# Patient Record
Sex: Male | Born: 1985 | Race: White | Hispanic: No | Marital: Married | Smoking: Former smoker
Health system: Southern US, Community
[De-identification: ages and names within clinical notes are randomized; demographics above are authoritative.]

## PROBLEM LIST (undated history)

## (undated) HISTORY — PX: KNEE SURGERY: SHX244

---

## 2014-11-12 ENCOUNTER — Emergency Department: Payer: Self-pay | Admitting: Emergency Medicine

## 2014-11-12 LAB — CBC WITH DIFFERENTIAL/PLATELET
BASOS PCT: 0.3 %
Basophil #: 0 10*3/uL (ref 0.0–0.1)
EOS PCT: 1.6 %
Eosinophil #: 0.2 10*3/uL (ref 0.0–0.7)
HCT: 44.1 % (ref 40.0–52.0)
HGB: 15.1 g/dL (ref 13.0–18.0)
LYMPHS ABS: 2 10*3/uL (ref 1.0–3.6)
Lymphocyte %: 22.3 %
MCH: 31.8 pg (ref 26.0–34.0)
MCHC: 34.1 g/dL (ref 32.0–36.0)
MCV: 93 fL (ref 80–100)
MONOS PCT: 5.6 %
Monocyte #: 0.5 x10 3/mm (ref 0.2–1.0)
Neutrophil #: 6.4 10*3/uL (ref 1.4–6.5)
Neutrophil %: 70.2 %
PLATELETS: 320 10*3/uL (ref 150–440)
RBC: 4.73 10*6/uL (ref 4.40–5.90)
RDW: 13.3 % (ref 11.5–14.5)
WBC: 9.2 10*3/uL (ref 3.8–10.6)

## 2014-11-12 LAB — COMPREHENSIVE METABOLIC PANEL
ALBUMIN: 4.2 g/dL (ref 3.4–5.0)
ALT: 25 U/L
ANION GAP: 7 (ref 7–16)
AST: 18 U/L (ref 15–37)
Alkaline Phosphatase: 77 U/L
BUN: 11 mg/dL (ref 7–18)
Bilirubin,Total: 0.7 mg/dL (ref 0.2–1.0)
Calcium, Total: 9 mg/dL (ref 8.5–10.1)
Chloride: 106 mmol/L (ref 98–107)
Co2: 27 mmol/L (ref 21–32)
Creatinine: 0.84 mg/dL (ref 0.60–1.30)
EGFR (African American): >60
EGFR (Non-African Amer.): >60
Glucose: 91 mg/dL (ref 65–99)
Osmolality: 278 (ref 275–301)
POTASSIUM: 4.1 mmol/L (ref 3.5–5.1)
Sodium: 140 mmol/L (ref 136–145)
Total Protein: 7.6 g/dL (ref 6.4–8.2)

## 2014-11-12 LAB — URINALYSIS, COMPLETE
BLOOD: NEGATIVE
Bacteria: NONE SEEN
Bilirubin,UR: NEGATIVE
Glucose,UR: NEGATIVE mg/dL (ref 0–75)
Ketone: NEGATIVE
Leukocyte Esterase: NEGATIVE
Nitrite: NEGATIVE
Ph: 6 (ref 4.5–8.0)
RBC,UR: 2 /HPF (ref 0–5)
SPECIFIC GRAVITY: 1.025 (ref 1.003–1.030)
Squamous Epithelial: <1
WBC UR: 2 /HPF (ref 0–5)

## 2014-11-12 LAB — LIPASE, BLOOD: Lipase: 78 U/L (ref 73–393)

## 2015-01-11 ENCOUNTER — Emergency Department: Payer: Self-pay | Admitting: Emergency Medicine

## 2015-01-25 ENCOUNTER — Inpatient Hospital Stay
Admit: 2015-01-25 | Disposition: A | Discharge status: Home / Self Care | Payer: Self-pay | Attending: Internal Medicine | Admitting: Internal Medicine

## 2015-01-25 LAB — COMPREHENSIVE METABOLIC PANEL
ALK PHOS: 78 U/L
ALT: 12 U/L — AB
AST: 16 U/L
Albumin: 4 g/dL
Anion Gap: 10 (ref 7–16)
BUN: 16 mg/dL
Bilirubin,Total: 0.6 mg/dL
CALCIUM: 9.4 mg/dL
CO2: 29 mmol/L
Chloride: 101 mmol/L
Creatinine: 0.74 mg/dL
EGFR (African American): >60
EGFR (Non-African Amer.): >60
Glucose: 100 mg/dL — ABNORMAL HIGH
Potassium: 3.7 mmol/L
SODIUM: 140 mmol/L
Total Protein: 7.7 g/dL

## 2015-01-25 LAB — CBC WITH DIFFERENTIAL/PLATELET
Basophil #: 0.1 10*3/uL (ref 0.0–0.1)
Basophil %: 1 %
EOS ABS: 0.2 10*3/uL (ref 0.0–0.7)
Eosinophil %: 2.1 %
HCT: 39.9 % — ABNORMAL LOW (ref 40.0–52.0)
HGB: 13.4 g/dL (ref 13.0–18.0)
Lymphocyte #: 2.4 10*3/uL (ref 1.0–3.6)
Lymphocyte %: 24.8 %
MCH: 30.2 pg (ref 26.0–34.0)
MCHC: 33.5 g/dL (ref 32.0–36.0)
MCV: 90 fL (ref 80–100)
Monocyte #: 0.7 x10 3/mm (ref 0.2–1.0)
Monocyte %: 7.6 %
NEUTROS PCT: 64.5 %
Neutrophil #: 6.3 10*3/uL (ref 1.4–6.5)
PLATELETS: 403 10*3/uL (ref 150–440)
RBC: 4.44 10*6/uL (ref 4.40–5.90)
RDW: 12.6 % (ref 11.5–14.5)
WBC: 9.7 10*3/uL (ref 3.8–10.6)

## 2015-01-26 LAB — MRSA PCR SCREENING

## 2015-01-27 LAB — VANCOMYCIN, TROUGH: Vancomycin, Trough: 11 ug/mL

## 2015-01-29 LAB — WOUND CULTURE

## 2015-01-30 LAB — CULTURE, BLOOD (SINGLE)

## 2015-02-02 LAB — WOUND CULTURE
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
## 2015-02-28 NOTE — Consult Note (Signed)
Chief Complaint:  Subjective/Chief Complaint Still sore, but no fevers. Pain controlled with po meds. Getting around well on crutches WBAT on left LE.   VITAL SIGNS/ANCILLARY NOTES: **Vital Signs.:   31-Mar-16 04:58  Temperature Temperature (F) 98.1  Pulse Pulse 70  Systolic BP Systolic BP 106  Diastolic BP (mmHg) Diastolic BP (mmHg) 67    08:22  Temperature Temperature (F) 98.2  Pulse Pulse 90  Systolic BP Systolic BP 119  Diastolic BP (mmHg) Diastolic BP (mmHg) 75   Lab Results: Routine Micro:  28-Mar-16 11:29   Organism 1 RARE STREPTOCOCCUS PYOGENES (GROUP A)   Assessment/Plan:  Assessment/Plan:  Assessment S/P I&D Left medial knee wound   Plan Convert to Keflex 500 QID for 10 days based on Strep species identified on admission swab culture. Will change dressing again this AM. Expect that he can be d/c'd today and will f/u with me next week for a wound check.   Electronic Signatures: Derald MacleodPoggi, Jeffrey (MD)  (Signed 31-Mar-16 09:04)  Authored: Chief Complaint, VITAL SIGNS/ANCILLARY NOTES, Lab Results, Assessment/Plan   Last Updated: 31-Mar-16 09:04 by Derald MacleodPoggi, Jeffrey (MD)
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
## 2015-02-28 NOTE — Consult Note (Signed)
Chief Complaint:  Subjective/Chief Complaint Pt. notes moderate medial left knee pain controlled with po meds. Lateral-sided left knee pain noted last night has resolved by this AM.   VITAL SIGNS/ANCILLARY NOTES: **Vital Signs.:   30-Mar-16 04:00  Temperature Temperature (F) 97.8  Pulse Pulse 63  Systolic BP Systolic BP 120  Diastolic BP (mmHg) Diastolic BP (mmHg) 74    08:25  Temperature Temperature (F) 98  Pulse Pulse 70  Systolic BP Systolic BP 128  Diastolic BP (mmHg) Diastolic BP (mmHg) 68   Brief Assessment:  GEN well developed, well nourished, no acute distress   Respiratory normal resp effort   Additional Physical Exam Dressing changed ths AM by my PA and iodoform gauze advanced. Wound healing well. Pt. is NV intact to left LE.   Assessment/Plan:  Assessment/Plan:  Assessment S/P I&D medial left knee wound.   Plan Continue present iv abx regimen until organism and sensitivities known.  We will change dressing again in AM, but expect he will be ready for D/C home tomorrow once sensitivities of organism known so as to prescribe appropriate po abx.  He is to start daily sterile saline wet to dry dressings to medial knee wound as of Friday.  We will see him back in office next week for a wound check.   Electronic Signatures: Derald MacleodPoggi, Jeffrey (MD)  (Signed 30-Mar-16 14:36)  Authored: Chief Complaint, VITAL SIGNS/ANCILLARY NOTES, Brief Assessment, Assessment/Plan   Last Updated: 30-Mar-16 14:36 by Derald MacleodPoggi, Jeffrey (MD)

## 2015-02-28 NOTE — Consult Note (Signed)
Brief Consult Note: Diagnosis: Infected left medial knee wound.   Patient was seen by consultant.   Consult note dictated.   Recommend to proceed with surgery or procedure.   Orders entered.   Discussed with Attending MD.   Comments: The patient has a fairly deep wound on the medial aspect of his left knee.  Although it does not appear to have involved his knee joint, I feel that it would be best to perform a formal I&D of the wound in the OR tomorrow.  The procedure, as well as the potential risks and benefits, were discussed with the patient and he agrees to proceed.  The case will be done tomorrow afternoon.  Thanks!.  Electronic Signatures: Flower Franko, JeffreyDerald Macleod (MD)  (Signed 28-Mar-16 18:01)  Authored: Brief Consult Note   Last Updated: 28-Mar-16 18:01 by Derald MacleodPoggi, Jeffrey (MD)
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
## 2015-07-15 ENCOUNTER — Ambulatory Visit
Admission: EM | Admit: 2015-07-15 | Discharge: 2015-07-15 | Disposition: A | Discharge status: Home / Self Care | Payer: Medicaid Other | Attending: Family Medicine | Admitting: Family Medicine

## 2015-07-15 ENCOUNTER — Encounter: Payer: Self-pay | Admitting: *Deleted

## 2015-07-15 DIAGNOSIS — S51811A Laceration without foreign body of right forearm, initial encounter: Secondary | ICD-10-CM | POA: Diagnosis not present

## 2015-07-15 MED ORDER — KETOROLAC TROMETHAMINE 60 MG/2ML IM SOLN
60.0000 mg | Freq: Once | INTRAMUSCULAR | Status: AC
  Administered 2015-07-15: 60 mg via INTRAMUSCULAR

## 2015-07-15 MED ORDER — MUPIROCIN 2 % EX OINT: 1.0000 "application " | TOPICAL_OINTMENT | Freq: Three times a day (TID) | CUTANEOUS | Status: DC

## 2015-07-15 MED ORDER — SULFAMETHOXAZOLE-TRIMETHOPRIM 800-160 MG PO TABS: 1.0000 | ORAL_TABLET | Freq: Two times a day (BID) | ORAL | Status: AC

## 2015-07-15 NOTE — Discharge Instructions (Signed)
3 days of clean and dry please The nshower/bather- remove bandage afterwards-wash gently -pat dry-apply topical antibiotic gel Keep covered for your work until sutures removed Return in 10 days for suture removal Return more quickly if any concerns about infection are noted. Thank you for choosing [place] today ! Rae Halsted PA-C   Laceration Care, Adult A laceration is a cut or lesion that goes through all layers of the skin and into the tissue just beneath the skin. TREATMENT  Some lacerations may not require closure. Some lacerations may not be able to be closed due to an increased risk of infection. It is important to see your caregiver as soon as possible after an injury to minimize the risk of infection and maximize the opportunity for successful closure. If closure is appropriate, pain medicines may be given, if needed. The wound will be cleaned to help prevent infection. Your caregiver will use stitches (sutures), staples, wound glue (adhesive), or skin adhesive strips to repair the laceration. These tools bring the skin edges together to allow for faster healing and a better cosmetic outcome. However, all wounds will heal with a scar. Once the wound has healed, scarring can be minimized by covering the wound with sunscreen during the day for 1 full year. HOME CARE INSTRUCTIONS  For sutures or staples:  Keep the wound clean and dry.  If you were given a bandage (dressing), you should change it at least once a day. Also, change the dressing if it becomes wet or dirty, or as directed by your caregiver.  Wash the wound with soap and water 2 times a day. Rinse the wound off with water to remove all soap. Pat the wound dry with a clean towel.  After cleaning, apply a thin layer of the antibiotic ointment as recommended by your caregiver. This will help prevent infection and keep the dressing from sticking.  You may shower as usual after the first 24 hours. Do not soak the wound in water  until the sutures are removed.  Only take over-the-counter or prescription medicines for pain, discomfort, or fever as directed by your caregiver.  Get your sutures or staples removed as directed by your caregiver. For skin adhesive strips:  Keep the wound clean and dry.  Do not get the skin adhesive strips wet. You may bathe carefully, using caution to keep the wound dry.  If the wound gets wet, pat it dry with a clean towel.  Skin adhesive strips will fall off on their own. You may trim the strips as the wound heals. Do not remove skin adhesive strips that are still stuck to the wound. They will fall off in time. For wound adhesive:  You may briefly wet your wound in the shower or bath. Do not soak or scrub the wound. Do not swim. Avoid periods of heavy perspiration until the skin adhesive has fallen off on its own. After showering or bathing, gently pat the wound dry with a clean towel.  Do not apply liquid medicine, cream medicine, or ointment medicine to your wound while the skin adhesive is in place. This may loosen the film before your wound is healed.  If a dressing is placed over the wound, be careful not to apply tape directly over the skin adhesive. This may cause the adhesive to be pulled off before the wound is healed.  Avoid prolonged exposure to sunlight or tanning lamps while the skin adhesive is in place. Exposure to ultraviolet light in the first year  will darken the scar.  The skin adhesive will usually remain in place for 5 to 10 days, then naturally fall off the skin. Do not pick at the adhesive film. You may need a tetanus shot if:  You cannot remember when you had your last tetanus shot.  You have never had a tetanus shot. If you get a tetanus shot, your arm may swell, get red, and feel warm to the touch. This is common and not a problem. If you need a tetanus shot and you choose not to have one, there is a rare chance of getting tetanus. Sickness from tetanus can  be serious. SEEK MEDICAL CARE IF:   You have redness, swelling, or increasing pain in the wound.  You see a red line that goes away from the wound.  You have yellowish-white fluid (pus) coming from the wound.  You have a fever.  You notice a bad smell coming from the wound or dressing.  Your wound breaks open before or after sutures have been removed.  You notice something coming out of the wound such as wood or glass.  Your wound is on your hand or foot and you cannot move a finger or toe. SEEK IMMEDIATE MEDICAL CARE IF:   Your pain is not controlled with prescribed medicine.  You have severe swelling around the wound causing pain and numbness or a change in color in your arm, hand, leg, or foot.  Your wound splits open and starts bleeding.  You have worsening numbness, weakness, or loss of function of any joint around or beyond the wound.  You develop painful lumps near the wound or on the skin anywhere on your body. MAKE SURE YOU:   Understand these instructions.  Will watch your condition.  Will get help right away if you are not doing well or get worse. Document Released: 10/16/2005 Document Revised: 01/08/2012 Document Reviewed: 04/11/2011 [facility] Patient Information 2015 [place], [place]. This information is not intended to replace advice given to you by your health care provider. Make sure you discuss any questions you have with your health care provider.

## 2015-07-15 NOTE — ED Notes (Signed)
Fell at work and lacerated right forearm and abraded left forearm. Laceration occurred at 11:00 the AM.

## 2015-07-15 NOTE — ED Provider Notes (Signed)
CSN: [id number]     Arrival date & time 07/15/15  1242 History   First MD Initiated Contact with Patient 07/15/15 1317     Chief Complaint  Patient presents with  . Extremity Laceration    right forearm   (Consider location/radiation/quality/duration/timing/severity/associated sxs/prior Treatment) HPI 29 yo WM presents with right forearm laceration. Working on Associate Professor at work. Slipped and cut himself on the edge of a metal bracket that ties the truss to the wood beam  after it is constructed  into units  Lacerated medial right forearm.   Active bleeding has stopped.  Denies loss of sensation or function. Wife present throughout and supportive. Had left leg injury in March 2016 requiring surgery. Tetanus updated at that time   History reviewed. No pertinent past medical history. Past Surgical History  Procedure Laterality Date  . Knee surgery Left    Family History  Problem Relation Age of Onset  . Heart attack Mother   . Heart attack Father    Social History  Substance Use Topics  . Smoking status: Current Every Day Smoker  . Smokeless tobacco: Never Used  . Alcohol Use: Yes    Review of Systems Constitutional: No fever.  Eyes: No visual changes. ENT:No sore throat. Cardiovascular:Negative for chest pain/palpitations Respiratory: Negative for shortness of breath Gastrointestinal: No abdominal pain. No nausea,vomiting, diarrhea Genitourinary: Negative for dysuria. Normal urination. Musculoskeletal: Negative for back pain. FROM extremities -right  forearm laceration.  Good grip and cap fill, full ROM elbow Skin: Negative for rash Neurological: Negative for headache, focal weakness or numbness  Allergies  Review of patient's allergies indicates no known allergies.  Home Medications   Prior to Admission medications   Medication Sig Start Date End Date Taking? Authorizing Provider  mupirocin ointment (BACTROBAN) 2 % Apply 1 application topically 3 (three) times  daily. 07/15/15   Rae Halsted, PA-C  sulfamethoxazole-trimethoprim (BACTRIM DS,SEPTRA DS) 800-160 MG per tablet Take 1 tablet by mouth 2 (two) times daily. 07/15/15 07/22/15  Rae Halsted, PA-C   Meds Ordered and Administered this Visit   Medications  ketorolac (TORADOL) injection 60 mg (60 mg Intramuscular Given 07/15/15 1344)  well tolerated- good relief of discomfort  BP 116/56 mmHg  Pulse 73  Temp(Src) 98 F (36.7 C) (Oral)  Resp 18  Ht  (1.676 m)  Wt 155 lb (70.308 kg)  BMI 25.03 kg/m2  SpO2 98% No data found.   Physical Exam  Constitutional: Alert and oriented, well appearing, VS are noted,  General : no acute distress; mildly concerned-" never had stitches" HEENT:  Head:normocephalic, atraumatic,                Eyes: conjugate gaze,negative conjunctiva     Ears:hearing grossly  WNL     Nose:normal,     Mouth/throat :Mucous membranes moist, Neck :  supple  Lung:   effort and breath sounds normal , no distress Heart- normal rate MSK:   nontender, normal ROM all extremities;normal flexion; ambulatory, on and off table without assistance- Right forearm with 2 lacerations. Upper lac is superficial skin only, linear 6 cm total but with only 2 central cm requiring reapproximation. Below that is a second more significant full thickness laceration ,curved with a flap and planed edges approx 5 cm in length. No neurosensory deficit reported, full function right hand and wrist, Neuro: CN ll-Xl as tested,grossly intact; normal gait, normal speech and language Skin:  Warm,dry,intact Psych: mood and affect WNL  ED Course  LACERATION REPAIR Date/Time: 07/15/2015 8:52 PM Performed by: Rae Halsted Authorized by: Hassan Rowan Consent: Verbal consent obtained. Risks and benefits: risks, benefits and alternatives were discussed Consent given by: patient and spouse Patient understanding: patient states understanding of the procedure being performed Patient identity confirmed:  verbally with patient and arm band Time out: Immediately prior to procedure a "time out" was called to verify the correct patient, procedure, equipment, support staff and site/side marked as required. Body area: upper extremity Location details: right lower arm Laceration length: 7 cm Foreign bodies: no foreign bodies Tendon involvement: none Nerve involvement: none Vascular damage: no Anesthesia: local infiltration Local anesthetic: lidocaine 1% with epinephrine Anesthetic total: 4 ml Patient sedated: no Preparation: Patient was prepped and draped in the usual sterile fashion. Irrigation solution: saline Irrigation method: syringe Amount of cleaning: standard Debridement: none Degree of undermining: none Skin closure: 3-0 nylon and Ethilon Number of sutures: 7 Technique: simple Approximation: close Approximation difficulty: simple Dressing: gauze roll and non-adhesive packing strip Patient tolerance: Patient tolerated the procedure well with no immediate complications   (including critical care time) Procedure addendum: I failed to comment that after the lower incision was closed attention was placed on the upper more superficial wound.Prior to the start: Both wounds had been scrubbed with Surgical prep sponges and thoroughly rinsed and irrigated with saline via syringe before closing. The upper wound was longer and very shallow except for the 2 cm center section which was re-approximated with 2 of the aforementioned sutures. The wounds were dressed together in a forearm wrap with vaseline gauze, roll gauze and Coban. Patient tolerated very well    Labs Review Labs Reviewed - No data to display  Imaging Review No results found.      MDM   1. Laceration of skin of right forearm, initial encounter   2. Laceration of right forearm without complication, initial encounter    Diagnosis and treatment discussed.Will cover with po and topical antibitotics . Keep clean and dry  x 3 days Then shower, then change dressing.  Questions fielded, expectations and recommendations reviewed. Patient expresses understanding.  Will return to [facility] with questions, concern or exacerbation- an in 10 days for suture removal  Discharge Medication List as of 07/15/2015  2:22 PM    START taking these medications   Details  mupirocin ointment (BACTROBAN) 2 % Apply 1 application topically 3 (three) times daily., Starting 07/15/2015, Until Discontinued, Normal    sulfamethoxazole-trimethoprim (BACTRIM DS,SEPTRA DS) 800-160 MG per tablet Take 1 tablet by mouth 2 (two) times daily., Starting 07/15/2015, Until Thu 07/22/15, Normal         Rae Halsted, PA-C 07/16/15 2121

## 2015-07-16 ENCOUNTER — Encounter: Payer: Self-pay | Admitting: Physician Assistant

## 2015-09-22 ENCOUNTER — Emergency Department
Admission: EM | Admit: 2015-09-22 | Discharge: 2015-09-22 | Disposition: A | Discharge status: Home / Self Care | Payer: Medicaid Other | Attending: Student | Admitting: Student

## 2015-09-22 ENCOUNTER — Encounter: Payer: Self-pay | Admitting: Emergency Medicine

## 2015-09-22 DIAGNOSIS — F1210 Cannabis abuse, uncomplicated: Secondary | ICD-10-CM | POA: Insufficient documentation

## 2015-09-22 DIAGNOSIS — F17200 Nicotine dependence, unspecified, uncomplicated: Secondary | ICD-10-CM | POA: Diagnosis not present

## 2015-09-22 DIAGNOSIS — F1110 Opioid abuse, uncomplicated: Secondary | ICD-10-CM | POA: Insufficient documentation

## 2015-09-22 LAB — URINE DRUG SCREEN, QUALITATIVE (ARMC ONLY)
AMPHETAMINES, UR SCREEN: NOT DETECTED
BARBITURATES, UR SCREEN: NOT DETECTED
BENZODIAZEPINE, UR SCRN: NOT DETECTED
Cannabinoid 50 Ng, Ur ~~LOC~~: POSITIVE — AB
Cocaine Metabolite,Ur ~~LOC~~: NOT DETECTED
MDMA (Ecstasy)Ur Screen: NOT DETECTED
METHADONE SCREEN, URINE: NOT DETECTED
Opiate, Ur Screen: NOT DETECTED
Phencyclidine (PCP) Ur S: NOT DETECTED
TRICYCLIC, UR SCREEN: NOT DETECTED

## 2015-09-22 LAB — COMPREHENSIVE METABOLIC PANEL
ALT: 20 U/L (ref 17–63)
AST: 22 U/L (ref 15–41)
Albumin: 4.6 g/dL (ref 3.5–5.0)
Alkaline Phosphatase: 63 U/L (ref 38–126)
Anion gap: 5 (ref 5–15)
BUN: 14 mg/dL (ref 6–20)
CO2: 31 mmol/L (ref 22–32)
CREATININE: 0.67 mg/dL (ref 0.61–1.24)
Calcium: 9.5 mg/dL (ref 8.9–10.3)
Chloride: 103 mmol/L (ref 101–111)
GFR calc Af Amer: >60 mL/min (ref >60)
GFR calc non Af Amer: >60 mL/min (ref >60)
Glucose, Bld: 90 mg/dL (ref 65–99)
POTASSIUM: 4.3 mmol/L (ref 3.5–5.1)
SODIUM: 139 mmol/L (ref 135–145)
Total Bilirubin: 0.7 mg/dL (ref 0.3–1.2)
Total Protein: 7.8 g/dL (ref 6.5–8.1)

## 2015-09-22 LAB — CBC
HEMATOCRIT: 45.4 % (ref 40.0–52.0)
Hemoglobin: 15.2 g/dL (ref 13.0–18.0)
MCH: 30.8 pg (ref 26.0–34.0)
MCHC: 33.4 g/dL (ref 32.0–36.0)
MCV: 92 fL (ref 80.0–100.0)
PLATELETS: 301 10*3/uL (ref 150–440)
RBC: 4.94 MIL/uL (ref 4.40–5.90)
RDW: 13.2 % (ref 11.5–14.5)
WBC: 6.4 10*3/uL (ref 3.8–10.6)

## 2015-09-22 NOTE — ED Provider Notes (Signed)
[facility] Emergency Department Provider Note  ____________________________________________  Time seen: Approximately 11:40 AM  I have reviewed the triage vital signs and the nursing notes.   HISTORY  Chief Complaint Addiction Problem    HPI Juan FanningRobert Preston Pedro Montez HagemanJr. is a 29 y.o. male with no chronic medical problems who presents for evaluation due to desire for oxycodone detox. Patient reports that he has used oxycodone for many years, gradual onset, intermittent since onset, currently severe. No SI, HI or audiovisual hallucinations. No chest pain or difficulty breathing, no coughing, sneezing, runny nose, congestion, vomiting, diarrhea, fevers or chills. He is otherwise been in good health but states that he needs help to stop abusing oxycodone.   History reviewed. No pertinent past medical history.  There are no active problems to display for this patient.   Past Surgical History  Procedure Laterality Date  . Knee surgery Left     Current Outpatient Rx  Name  Route  Sig  Dispense  Refill  . mupirocin ointment (BACTROBAN) 2 %   Topical   Apply 1 application topically 3 (three) times daily. Patient not taking: Reported on 09/22/2015   22 g   0     Allergies Review of patient's allergies indicates no known allergies.  Family History  Problem Relation Age of Onset  . Heart attack Mother   . Heart attack Father     Social History Social History  Substance Use Topics  . Smoking status: Current Every Day Smoker  . Smokeless tobacco: Never Used  . Alcohol Use: Yes    Review of Systems Constitutional: No fever/chills Eyes: No visual changes. ENT: No sore throat. Cardiovascular: Denies chest pain. Respiratory: Denies shortness of breath. Gastrointestinal: No abdominal pain.  No nausea, no vomiting.  No diarrhea.  No constipation. Genitourinary: Negative for dysuria. Musculoskeletal: Negative for back pain. Skin: Negative for  rash. Neurological: Negative for headaches, focal weakness or numbness.  10-point ROS otherwise negative.  ____________________________________________   PHYSICAL EXAM:  VITAL SIGNS: ED Triage Vitals  Enc Vitals Group     BP 09/22/15 1114 148/93 mmHg     Pulse Rate 09/22/15 1114 97     Resp 09/22/15 1114 20     Temp 09/22/15 1114 97.9 F (36.6 C)     Temp Source 09/22/15 1114 Oral     SpO2 09/22/15 1114 97 %     Weight 09/22/15 1114 158 lb (71.668 kg)     Height 09/22/15 1114 5\' 7"  (1.702 m)     Head Cir --      Peak Flow --      Pain Score --      Pain Loc --      Pain Edu? --      Excl. in GC? --     Constitutional: Alert and oriented. Well appearing and in no acute distress. Eyes: Conjunctivae are normal. PERRL. EOMI. Head: Atraumatic. Nose: No congestion/rhinnorhea. Mouth/Throat: Mucous membranes are moist.  Oropharynx non-erythematous. Neck: No stridor.   Cardiovascular: Normal rate, regular rhythm. Grossly normal heart sounds.  Good peripheral circulation. Respiratory: Normal respiratory effort.  No retractions. Lungs CTAB. Gastrointestinal: Soft and nontender. No distention.  No CVA tenderness. Genitourinary: deferred Musculoskeletal: No lower extremity tenderness nor edema.  No joint effusions. Neurologic:  Normal speech and language. No gross focal neurologic deficits are appreciated. No gait instability. Skin:  Skin is warm, dry and intact. No rash noted. Psychiatric: Mood and affect are normal. Speech and behavior are normal.  ____________________________________________   LABS (all labs ordered are listed, but only abnormal results are displayed)  Labs Reviewed  URINE DRUG SCREEN, QUALITATIVE ([facility] ONLY) - Abnormal; Notable for the following:    Cannabinoid 50 Ng, Ur [place] POSITIVE (*)    All other components within normal limits  CBC  COMPREHENSIVE METABOLIC PANEL    ____________________________________________  EKG  none ____________________________________________  RADIOLOGY  none ____________________________________________   PROCEDURES  Procedure(s) performed: None  Critical Care performed: No  ____________________________________________   INITIAL IMPRESSION / ASSESSMENT AND PLAN / ED COURSE  Pertinent labs & imaging results that were available during my care of the patient were reviewed by me and considered in my medical decision making (see chart for details).  Juan Fanning Carachure Montez Hageman. is a 29 y.o. male with no chronic medical problems who presents for evaluation due to desire for oxycodone detox. On exam, he is very well-appearing and in no acute distress. Vital signs stable, he is afebrile. He has a benign physical exam  And no acute medical complaints. No SI, HI or visual hallucinations, no indications for involuntary commitment. Labs reviewed. Normal CBC and CMP. Urine drug screen is positive for cannabis only. Discussed with behavioral health who will evaluate him and help to arrange placement in detox program versus discuss outpatient resources with him. He is medically cleared.  ----------------------------------------- 2:57 PM on 09/22/2015 -----------------------------------------  [name] of Behavioral Health has discussed outpatient resources with the patient and he will follow up with [facility] substance abuse intensive outpatient therapy. He is not a candidate for [facility]. He is comfortable with the discharge plan. DC home. ____________________________________________   FINAL CLINICAL IMPRESSION(S) / ED DIAGNOSES  Final diagnoses:  Narcotic abuse, continuous      Gayla Doss, MD 09/22/15 1458

## 2015-09-22 NOTE — ED Notes (Signed)
Patient assigned to appropriate care area. Patient oriented to unit/care area: Informed that, for their safety, care areas are designed for safety and monitored by security cameras at all times; and visiting hours explained to patient. Patient verbalizes understanding, and verbal contract for safety obtained. 

## 2015-09-22 NOTE — Progress Notes (Signed)
Per request of ER MD Inocencio Homes(Gayle), writer provided the pt. with information and instructions on how to access Outpatient Mental Health & Substance Abuse Treatment ([facility]).   TTS has called [facility] to verify bed availability and the representative has informed staff that this pt has attemted to come there for services on several occasions and been turned away. [place] [facility] [place] and needs to call his insurance provider to located an approved detox facility.     Representative [facility] Contact information [phone]. This information will be provided to the pt.    09/22/2015 Cheryl FlashNicole Dvonte Gatliff, MS, NCC, LPCA Therapeutic Triage Specialist

## 2015-09-22 NOTE — ED Notes (Signed)
Want help with drug abuse  States he abuses oxy.

## 2016-01-21 ENCOUNTER — Encounter: Payer: Self-pay | Admitting: Emergency Medicine

## 2016-01-21 ENCOUNTER — Emergency Department
Admission: EM | Admit: 2016-01-21 | Discharge: 2016-01-21 | Disposition: A | Discharge status: Home / Self Care | Payer: Medicaid Other | Attending: Emergency Medicine | Admitting: Emergency Medicine

## 2016-01-21 DIAGNOSIS — H6691 Otitis media, unspecified, right ear: Secondary | ICD-10-CM | POA: Insufficient documentation

## 2016-01-21 DIAGNOSIS — F17200 Nicotine dependence, unspecified, uncomplicated: Secondary | ICD-10-CM | POA: Insufficient documentation

## 2016-01-21 DIAGNOSIS — H671 Otitis media in diseases classified elsewhere, right ear: Secondary | ICD-10-CM

## 2016-01-21 MED ORDER — AMOXICILLIN 500 MG PO TABS: 500.0000 mg | ORAL_TABLET | Freq: Three times a day (TID) | ORAL | Status: DC

## 2016-01-21 MED ORDER — CIPROFLOXACIN-HYDROCORTISONE 0.2-1 % OT SUSP: 3.0000 [drp] | Freq: Two times a day (BID) | OTIC | Status: DC

## 2016-01-21 MED ORDER — HYDROCODONE-ACETAMINOPHEN 5-325 MG PO TABS: 1.0000 | ORAL_TABLET | ORAL | Status: DC | PRN

## 2016-01-21 NOTE — ED Provider Notes (Signed)
[facility] Emergency Department Provider Note  ____________________________________________  Time seen: Approximately 3:23 PM  I have reviewed the triage vital signs and the nursing notes.   HISTORY  Chief Complaint Otalgia    HPI [name]. is a 30 y.o. male presents for evaluation of right ear pain for 2-3 days. Patient reports shooting up and now gone but the exhaust went right into his ear creating a sudden loud noise. Patient really had drainage and ringing in his ear. Now he currently complains of having dizziness with changes of position and constant pain with swallowing.  History reviewed. No pertinent past medical history.  There are no active problems to display for this patient.   Past Surgical History  Procedure Laterality Date  . Knee surgery Left     Current Outpatient Rx  Name  Route  Sig  Dispense  Refill  . amoxicillin (AMOXIL) 500 MG tablet   Oral   Take 1 tablet (500 mg total) by mouth 3 (three) times daily.   30 tablet   0   . ciprofloxacin-hydrocortisone (CIPRO HC) otic suspension   Right Ear   Place 3 drops into the right ear 2 (two) times daily.   10 mL   0   . HYDROcodone-acetaminophen (NORCO) 5-325 MG tablet   Oral   Take 1-2 tablets by mouth every 4 (four) hours as needed for moderate pain.   15 tablet   0     Allergies Review of patient's allergies indicates no known allergies.  Family History  Problem Relation Age of Onset  . Heart attack Mother   . Heart attack Father     Social History Social History  Substance Use Topics  . Smoking status: Current Every Day Smoker  . Smokeless tobacco: Never Used  . Alcohol Use: Yes    Review of Systems Constitutional: No fever/chills ENT: Right eardrum pain. Neurological: Negative for headaches, focal weakness or numbness.  10-point ROS otherwise negative.  ____________________________________________   PHYSICAL EXAM:  VITAL SIGNS: ED  Triage Vitals  Enc Vitals Group     BP 01/21/16 1411 133/73 mmHg     Pulse Rate 01/21/16 1411 74     Resp 01/21/16 1411 16     Temp 01/21/16 1411 98.1 F (36.7 C)     Temp Source 01/21/16 1411 Oral     SpO2 01/21/16 1411 100 %     Weight 01/21/16 1411 165 lb (74.844 kg)     Height 01/21/16 1411  (1.702 m)     Head Cir --      Peak Flow --      Pain Score 01/21/16 1412 8     Pain Loc --      Pain Edu? --      Excl. in GC? --     Constitutional: Alert and oriented. Well appearing and in no acute distress. Head: Right TM extremely erythematous no evidence of puncture wound if any at pinpoint and not detectable.. Mouth/Throat: Mucous membranes are moist.  Oropharynx non-erythematous. Neck: No stridor.   Neurologic:  Normal speech and language. No gross focal neurologic deficits are appreciated. No gait instability. Skin:  Skin is warm, dry and intact. No rash noted. Psychiatric: Mood and affect are normal. Speech and behavior are normal.  ____________________________________________   LABS (all labs ordered are listed, but only abnormal results are displayed)  Labs Reviewed - No data to display    PROCEDURES  Procedure(s) performed: None  Critical  Care performed: No  ____________________________________________   INITIAL IMPRESSION / ASSESSMENT AND PLAN / ED COURSE  Pertinent labs & imaging results that were available during my care of the patient were reviewed by me and considered in my medical decision making (see chart for details).  Acute right ear trauma secondary to the loud noise. Acute right otitis media. Rx given for amoxicillin 500 mg 3 times a day and ciprofloxacin HC otic drops. Patient to follow up with ENT if symptomatology worsens. ____________________________________________   FINAL CLINICAL IMPRESSION(S) / ED DIAGNOSES  Final diagnoses:  Otitis media of right ear in disease classified elsewhere     This chart was dictated using voice  recognition software/Dragon. Despite best efforts to proofread, errors can occur which can change the meaning. Any change was purely unintentional.   Evangeline Dakinharles M Ylianna Almanzar, PA-C 01/21/16 1605  Sharman CheekPhillip Stafford, MD 01/24/16 [phone]
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
## 2016-01-21 NOTE — Discharge Instructions (Signed)
Ear Drops, Adult You have been diagnosed with a condition requiring you to put drops of medicine into your outer ear. HOME CARE INSTRUCTIONS   Put drops in the affected ear as instructed. After putting the drops in, you will need to lie down with the affected ear facing up for ten minutes so the drops will remain in the ear canal and run down and fill the canal. Continue using the ear drops for as long as directed by your health care provider.  Prior to getting up, put a cotton ball gently in your ear canal. Leave enough of the cotton ball out so it can be easily removed. Do not attempt to push this down into the canal with a cotton-tipped swab or other instrument.  Do not irrigate or wash out your ears if you have had a perforated eardrum or mastoid surgery, or unless instructed to do so by your health care provider.  Keep appointments with your health care provider as instructed.  Finish all medicine, or use for the length of time prescribed by your health care provider. Continue the drops even if your problem seems to be doing well after a couple days, or continue as instructed. SEEK MEDICAL CARE IF:  You become worse or develop increasing pain.  You notice any unusual drainage from your ear (particularly if the drainage has a bad smell).  You develop hearing difficulties.  You experience a serious form of dizziness in which you feel as if the room is spinning, and you feel nauseated (vertigo).  The outside of your ear becomes red or swollen or both. This may be a sign of an allergic reaction. MAKE SURE YOU:   Understand these instructions.  Will watch your condition.  Will get help right away if you are not doing well or get worse.   This information is not intended to replace advice given to you by your health care provider. Make sure you discuss any questions you have with your health care provider.   Document Released: 10/10/2001 Document Revised: 11/06/2014 Document  Reviewed: 05/13/2013 Elsevier Interactive Patient Education 2016 [employer]  Otitis Media, Adult Otitis media is redness, soreness, and puffiness (swelling) in the space just behind your eardrum (middle ear). It may be caused by allergies or infection. It often happens along with a cold. HOME CARE  Take your medicine as told. Finish it even if you start to feel better.  Only take over-the-counter or prescription medicines for pain, discomfort, or fever as told by your doctor.  Follow up with your doctor as told. GET HELP IF:  You have otitis media only in one ear, or bleeding from your nose, or both.  You notice a lump on your neck.  You are not getting better in 3-5 days.  You feel worse instead of better. GET HELP RIGHT AWAY IF:   You have pain that is not helped with medicine.  You have puffiness, redness, or pain around your ear.  You get a stiff neck.  You cannot move part of your face (paralysis).  You notice that the bone behind your ear hurts when you touch it. MAKE SURE YOU:   Understand these instructions.  Will watch your condition.  Will get help right away if you are not doing well or get worse.   This information is not intended to replace advice given to you by your health care provider. Make sure you discuss any questions you have with your health care provider.   Document  Released: 04/03/2008 Document Revised: 11/06/2014 Document Reviewed: 05/13/2013 Elsevier Interactive Patient Education [employer]
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
## 2017-03-11 ENCOUNTER — Emergency Department: Payer: Self-pay

## 2017-03-11 ENCOUNTER — Encounter: Payer: Self-pay | Admitting: Emergency Medicine

## 2017-03-11 ENCOUNTER — Emergency Department
Admission: EM | Admit: 2017-03-11 | Discharge: 2017-03-11 | Disposition: A | Discharge status: Home / Self Care | Payer: Self-pay | Attending: Emergency Medicine | Admitting: Emergency Medicine

## 2017-03-11 DIAGNOSIS — W01198A Fall on same level from slipping, tripping and stumbling with subsequent striking against other object, initial encounter: Secondary | ICD-10-CM | POA: Insufficient documentation

## 2017-03-11 DIAGNOSIS — Y939 Activity, unspecified: Secondary | ICD-10-CM | POA: Insufficient documentation

## 2017-03-11 DIAGNOSIS — S20211A Contusion of right front wall of thorax, initial encounter: Secondary | ICD-10-CM | POA: Insufficient documentation

## 2017-03-11 DIAGNOSIS — Y999 Unspecified external cause status: Secondary | ICD-10-CM | POA: Insufficient documentation

## 2017-03-11 DIAGNOSIS — Y929 Unspecified place or not applicable: Secondary | ICD-10-CM | POA: Insufficient documentation

## 2017-03-11 DIAGNOSIS — F17210 Nicotine dependence, cigarettes, uncomplicated: Secondary | ICD-10-CM | POA: Insufficient documentation

## 2017-03-11 MED ORDER — IBUPROFEN 800 MG PO TABS: 800.0000 mg | ORAL_TABLET | Freq: Three times a day (TID) | ORAL | 0 refills | Status: AC

## 2017-03-11 MED ORDER — ONDANSETRON HCL 4 MG/2ML IJ SOLN
INTRAMUSCULAR | Status: AC
  Filled 2017-03-11: qty 2

## 2017-03-11 NOTE — ED Provider Notes (Signed)
[facility] Emergency Department Provider Note  ____________________________________________   First MD Initiated Contact with Patient 03/11/17 1703     (approximate)  I have reviewed the triage vital signs and the nursing notes.   HISTORY  Chief Complaint Rib Injury    HPI [name]. is a 31 y.o. male is here with complaint of right-sided anterior chest wall pain. Patient states that he fell forward into a wood pile on Friday (2 days ago). Patient states that he has been taking Tylenol and ibuprofen over-the-counter for control of his pain. He denies any difficulty breathing but states that he does have increased pain with deep inspiration. Patient continues to smoke cigarettes daily. He denies any head injury or loss of consciousness during this fall. Currently he rates his pain as an 8 out of 10.   History reviewed. No pertinent past medical history.  There are no active problems to display for this patient.   Past Surgical History:  Procedure Laterality Date  . KNEE SURGERY Left     Prior to Admission medications   Medication Sig Start Date End Date Taking? Authorizing Provider  ibuprofen (ADVIL,MOTRIN) 800 MG tablet Take 1 tablet (800 mg total) by mouth 3 (three) times daily. 03/11/17   Tommi Rumps, PA-C    Allergies Patient has no known allergies.  Family History  Problem Relation Age of Onset  . Heart attack Mother   . Heart attack Father     Social History Social History  Substance Use Topics  . Smoking status: Current Every Day Smoker  . Smokeless tobacco: Never Used  . Alcohol use Yes    Review of Systems Constitutional: No fever/chills Eyes: No visual changes. ENT: No trauma Cardiovascular: Denies chest pain. Respiratory: Denies shortness of breath. Gastrointestinal: No abdominal pain.  Musculoskeletal: Negative for back pain.  Positive for right-sided chest wall pain. Skin: Positive for superficial  abrasions. Neurological: Negative for headaches, focal weakness or numbness.   ____________________________________________   PHYSICAL EXAM:  VITAL SIGNS: ED Triage Vitals  Enc Vitals Group     BP 03/11/17 1646 (!) 127/93     Pulse Rate 03/11/17 1646 72     Resp --      Temp 03/11/17 1646 98.2 F (36.8 C)     Temp Source 03/11/17 1646 Oral     SpO2 03/11/17 1646 98 %     Weight 03/11/17 1648 160 lb (72.6 kg)     Height 03/11/17 1648 5\' 6"  (1.676 m)     Head Circumference --      Peak Flow --      Pain Score 03/11/17 1646 8     Pain Loc --      Pain Edu? --      Excl. in [place]? --    Constitutional: Alert and oriented. Well appearing and in no acute distress. Eyes: Conjunctivae are normal. PERRL. EOMI. Head: Atraumatic. Nose: No congestion/rhinnorhea. Neck: No stridor.   Cardiovascular: Normal rate, regular rhythm. Grossly normal heart sounds.  Good peripheral circulation. Respiratory: Normal respiratory effort.  No retractions. Lungs CTAB. Gastrointestinal: Soft and nontender. No distention.  Musculoskeletal: On examination of the anterior chest wall there is some soft tissue tenderness present just below the right clavicle. No deformity is noted. Skin is intact. No ecchymosis or abrasions are seen. Right shoulder range of motion is within normal limits and without crepitus. No restriction was noted. Neurologic:  Normal speech and language. No gross focal neurologic deficits  are appreciated. No gait instability. Skin:  Skin is warm, dry and intact. No rash noted. Psychiatric: Mood and affect are normal. Speech and behavior are normal.  ____________________________________________   LABS (all labs ordered are listed, but only abnormal results are displayed)  Labs Reviewed - No data to display  RADIOLOGY Chest x-ray per radiologist is negative for cardiopulmonary disease. I, Tommi Rumpshonda L Yomara Toothman, personally viewed and evaluated these images (plain radiographs) as part of my  medical decision making, as well as reviewing the written report by the radiologist.  ____________________________________________   PROCEDURES  Procedure(s) performed: None  Procedures  Critical Care performed: No  ____________________________________________   INITIAL IMPRESSION / ASSESSMENT AND PLAN / ED COURSE  Pertinent labs & imaging results that were available during my care of the patient were reviewed by me and considered in my medical decision making (see chart for details).  Patient continue taking over-the-counter Tylenol and ibuprofen as needed for pain. He is also encouraged to use ice packs if needed. He also denies discussed decreasing his smoking and to take deep breaths to avoid pneumonia. Patient does not have a PCP or [facility] if any continued problems.    ____________________________________________   FINAL CLINICAL IMPRESSION(S) / ED DIAGNOSES  Final diagnoses:  Chest wall contusion, right, initial encounter      NEW MEDICATIONS STARTED DURING THIS VISIT:  Discharge Medication List as of 03/11/2017  5:49 PM    START taking these medications   Details  ibuprofen (ADVIL,MOTRIN) 800 MG tablet Take 1 tablet (800 mg total) by mouth 3 (three) times daily., Starting Sun 03/11/2017, Print         Note:  This document was prepared using Dragon voice recognition software and may include unintentional dictation errors.    Tommi RumpsSummers, Magdeline Prange L, PA-C 03/11/17 Leveda [id number]    Quale, Mark, MD 03/11/17 2127

## 2017-03-11 NOTE — Discharge Instructions (Signed)
Continue taking Tylenol as needed. Prescription for ibuprofen 800 mg with food to be taken 3 times a day only. You may also use ice packs or cold compresses to chest. [facility] if any continued problems.

## 2017-03-11 NOTE — ED Triage Notes (Signed)
Pt with fall from standing onto wood pile Friday. Pt with right rib pain that is 8/10 with breathing.

## 2017-03-11 NOTE — ED Notes (Signed)
See triage note.  states he fell from a standing position  Landed on right side of chest/rib area   Having increased pain with movement and inspiration

## 2020-04-26 ENCOUNTER — Ambulatory Visit
Admission: EM | Admit: 2020-04-26 | Discharge: 2020-04-26 | Disposition: A | Discharge status: Home / Self Care | Payer: BLUE CROSS/BLUE SHIELD | Attending: Family Medicine | Admitting: Family Medicine

## 2020-04-26 ENCOUNTER — Other Ambulatory Visit: Payer: Self-pay

## 2020-04-26 ENCOUNTER — Encounter: Payer: Self-pay | Admitting: Emergency Medicine

## 2020-04-26 DIAGNOSIS — Z20822 Contact with and (suspected) exposure to covid-19: Secondary | ICD-10-CM | POA: Diagnosis not present

## 2020-04-26 DIAGNOSIS — A938 Other specified arthropod-borne viral fevers: Secondary | ICD-10-CM | POA: Diagnosis present

## 2020-04-26 LAB — SARS CORONAVIRUS 2 (TAT 6-24 HRS): SARS Coronavirus 2: NEGATIVE

## 2020-04-26 MED ORDER — DOXYCYCLINE HYCLATE 100 MG PO CAPS: 100.0000 mg | ORAL_CAPSULE | Freq: Two times a day (BID) | ORAL | 0 refills | Status: AC

## 2020-04-26 NOTE — ED Triage Notes (Signed)
Pt c/o headache, body aches and fever (101-102). Started about 3 days ago. He states he removed a tick on his right buttock about 9 days ago. He states the area had a red ring around it. Pt has been taking tylenol.

## 2020-04-26 NOTE — Discharge Instructions (Signed)
Antibiotic as prescribed.  Wear sunscreen.  Take with food.  Take care  Dr. Adriana Simas

## 2020-04-26 NOTE — ED Provider Notes (Signed)
[facility]    CSN: [id number] Arrival date & time: 04/26/20  1015  History   Chief Complaint Chief Complaint  Patient presents with  . Headache  . Fever   HPI  34 year old male presents with fever, headache, neck pain.  Patient reports that his symptoms started on Friday.  Patient reports that he suffered a tick bite approximately 9 days ago on his right buttock.  Patient reports that he had a surrounding rash which now has improved.  Patient reports fever for the past several days.  T-max 101.5.  He also reports headache, neck pain and stiffness.  Patient states that he just does not feel well.  Patient is concerned that he may have recommended spotted fever.  He has been treating his fever with Tylenol.  He is currently afebrile.  No respiratory symptoms.  No reported sick contacts.  No other complaints.  Past Surgical History:  Procedure Laterality Date  . KNEE SURGERY Left     Home Medications    Prior to Admission medications   Medication Sig Start Date End Date Taking? Authorizing Provider  doxycycline (VIBRAMYCIN) 100 MG capsule Take 1 capsule (100 mg total) by mouth 2 (two) times daily for 10 days. 04/26/20 05/06/20  Coral Spikes, DO  ibuprofen (ADVIL,MOTRIN) 800 MG tablet Take 1 tablet (800 mg total) by mouth 3 (three) times daily. 03/11/17   Johnn Hai, PA-C    Family History Family History  Problem Relation Age of Onset  . Heart attack Mother   . Heart attack Father     Social History Social History   Tobacco Use  . Smoking status: Former Research scientist (life sciences)  . Smokeless tobacco: Current User    Types: Snuff  Vaping Use  . Vaping Use: Never used  Substance Use Topics  . Alcohol use: Yes  . Drug use: No     Allergies   Patient has no known allergies.   Review of Systems Review of Systems  Constitutional: Positive for fever.  Musculoskeletal: Positive for neck pain.  Neurological: Positive for headaches.   Physical Exam Triage Vital  Signs ED Triage Vitals  Enc Vitals Group     BP 04/26/20 1107 (!) 146/95     Pulse Rate 04/26/20 1107 62     Resp 04/26/20 1107 18     Temp 04/26/20 1107 98.9 F (37.2 C)     Temp Source 04/26/20 1107 Oral     SpO2 04/26/20 1107 100 %     Weight 04/26/20 1103 160 lb 0.9 oz (72.6 kg)     Height 04/26/20 1103 5\' 6"  (1.676 m)     Head Circumference --      Peak Flow --      Pain Score 04/26/20 1103 4     Pain Loc --      Pain Edu? --      Excl. in [place]? --    Updated Vital Signs BP (!) 146/95 (BP Location: Right Arm)   Pulse 62   Temp 98.9 F (37.2 C) (Oral)   Resp 18   Ht 5\' 6"  (1.676 m)   Wt 72.6 kg   SpO2 100%   BMI 25.83 kg/m   Visual Acuity Right Eye Distance:   Left Eye Distance:   Bilateral Distance:    Right Eye Near:   Left Eye Near:    Bilateral Near:     Physical Exam Vitals and nursing note reviewed.  Constitutional:      General: He  is not in acute distress.    Appearance: Normal appearance. He is not ill-appearing.  HENT:     Head: Normocephalic and atraumatic.  Eyes:     General:        Right eye: No discharge.        Left eye: No discharge.     Conjunctiva/sclera: Conjunctivae normal.  Cardiovascular:     Rate and Rhythm: Normal rate and regular rhythm.     Heart sounds: No murmur heard.   Pulmonary:     Effort: Pulmonary effort is normal.     Breath sounds: Normal breath sounds. No wheezing, rhonchi or rales.  Skin:    Comments: Right buttock with an area of erythema with central eschar.  No appreciable evidence of erythema migrans at this time.  Neurological:     Mental Status: He is alert.  Psychiatric:        Mood and Affect: Mood normal.        Behavior: Behavior normal.    UC Treatments / Results  Labs (all labs ordered are listed, but only abnormal results are displayed) Labs Reviewed  SARS CORONAVIRUS 2 (TAT 6-24 HRS)    EKG   Radiology No results found.  Procedures Procedures (including critical care  time)  Medications Ordered in UC Medications - No data to display  Initial Impression / Assessment and Plan / UC Course  I have reviewed the triage vital signs and the nursing notes.  Pertinent labs & imaging results that were available during my care of the patient were reviewed by me and considered in my medical decision making (see chart for details).    34 year old male presents with a suspected tickborne illness.  Offered testing for Lyme, RMSF, and ehrlichiosis.  Patient declined and elected for empiric treatment.  Doxycycline as prescribed.  Final Clinical Impressions(s) / UC Diagnoses   Final diagnoses:  Tick borne fever     Discharge Instructions     Antibiotic as prescribed.  Wear sunscreen.  Take with food.  Take care  Dr. Adriana Simas    ED Prescriptions    Medication Sig Dispense Auth. Provider   doxycycline (VIBRAMYCIN) 100 MG capsule Take 1 capsule (100 mg total) by mouth 2 (two) times daily for 10 days. 20 capsule Tommie Sams, DO     PDMP not reviewed this encounter.   Tommie Sams, [place] 04/26/20 1419

## 2022-01-18 ENCOUNTER — Telehealth: Payer: Self-pay | Admitting: Physician Assistant

## 2022-01-18 ENCOUNTER — Other Ambulatory Visit: Payer: Self-pay

## 2022-01-18 ENCOUNTER — Ambulatory Visit (INDEPENDENT_AMBULATORY_CARE_PROVIDER_SITE_OTHER)
Admit: 2022-01-18 | Discharge: 2022-01-18 | Disposition: A | Discharge status: Home / Self Care | Payer: BLUE CROSS/BLUE SHIELD | Attending: *Deleted | Admitting: *Deleted

## 2022-01-18 ENCOUNTER — Ambulatory Visit
Admission: EM | Admit: 2022-01-18 | Discharge: 2022-01-18 | Disposition: A | Discharge status: Home / Self Care | Payer: BLUE CROSS/BLUE SHIELD | Attending: Emergency Medicine | Admitting: Emergency Medicine

## 2022-01-18 DIAGNOSIS — R1031 Right lower quadrant pain: Secondary | ICD-10-CM

## 2022-01-18 DIAGNOSIS — R102 Pelvic and perineal pain: Secondary | ICD-10-CM

## 2022-01-18 DIAGNOSIS — N50811 Right testicular pain: Secondary | ICD-10-CM | POA: Diagnosis present

## 2022-01-18 DIAGNOSIS — N451 Epididymitis: Secondary | ICD-10-CM

## 2022-01-18 LAB — URINALYSIS, ROUTINE W REFLEX MICROSCOPIC
Bilirubin Urine: NEGATIVE
Glucose, UA: NEGATIVE mg/dL
Hgb urine dipstick: NEGATIVE
Ketones, ur: NEGATIVE mg/dL
Leukocytes,Ua: NEGATIVE
Nitrite: NEGATIVE
Protein, ur: NEGATIVE mg/dL
Specific Gravity, Urine: >1.03 — ABNORMAL HIGH (ref 1.005–1.030)
pH: 6 (ref 5.0–8.0)

## 2022-01-18 MED ORDER — LEVOFLOXACIN 750 MG PO TABS: 750.0000 mg | ORAL_TABLET | Freq: Every day | ORAL | 0 refills | Status: AC

## 2022-01-18 NOTE — Discharge Instructions (Addendum)
-  I have ordered an ultrasound for you.  Please proceed to [facility] entrance and register. ?- Once I get the report I will give you a call. ?-I believe you may have epididymitis but would like to get the ultrasound to check for that and other possibilities. ?

## 2022-01-18 NOTE — Telephone Encounter (Signed)
Called to discuss results of patient's scrotal ultrasound.  Ultrasound is reassuring.  No significant abnormalities noted.  Only hydroceles.  Given patient's level of pain, I am still concerned about possible epididymitis or a sending urinary infection.  Sent Levaquin to pharmacy.  Advised patient close monitoring and if his pain gets worse she needs to be seen again and reexamined.  May potentially need CT of abdomen pelvis if not improving or if symptoms were to worsen.  Or, referral to urology. ?

## 2022-01-18 NOTE — ED Provider Notes (Signed)
?[facility] ? ? ? ?CSN: [id number] ?Arrival date & time: 01/18/22  1149 ? ? ?  ? ?History   ?Chief Complaint ?Chief Complaint  ?Patient presents with  ? Groin Pain  ? ? ?HPI ?Juan Fanningobert Preston Boerema Montez HagemanJr. is a 36 y.o. male presenting for concerns about right lower abdominal pain with radiation to the right groin region and into the right testicle as well as to the right thigh for the past 1 month.  Symptoms seem to be getting worse over the past several days.  Patient denies any associated dysuria, inner frequency or urgency, hematuria, urethral discharge.  States his scrotum might be a little enlarged and more red than normal.  Denies any concern for STIs.  States he is married and has kids.  Patient says standing for a while makes the pain worse and laying or sitting improves that.  Has tried ibuprofen without relief.  Denies any associated fever and no similar symptoms in the past. ? ?HPI ? ?History reviewed. No pertinent past medical history. ? ?There are no problems to display for this patient. ? ? ?Past Surgical History:  ?Procedure Laterality Date  ? KNEE SURGERY Left   ? ? ? ? ? ?Home Medications   ? ?Prior to Admission medications   ?Medication Sig Start Date End Date Taking? Authorizing Provider  ?ibuprofen (ADVIL,MOTRIN) 800 MG tablet Take 1 tablet (800 mg total) by mouth 3 (three) times daily. 03/11/17   Tommi RumpsSummers, Rhonda L, PA-C  ?levofloxacin (LEVAQUIN) 750 MG tablet Take 1 tablet (750 mg total) by mouth daily for 14 days. 01/18/22 02/01/22  Shirlee LatchEaves, Jannat Rosemeyer B, PA-C  ? ? ?Family History ?Family History  ?Problem Relation Age of Onset  ? Heart attack Mother   ? Heart attack Father   ? ? ?Social History ?Social History  ? ?Tobacco Use  ? Smoking status: Former  ? Smokeless tobacco: Current  ?  Types: Snuff  ?Vaping Use  ? Vaping Use: Never used  ?Substance Use Topics  ? Alcohol use: Yes  ? Drug use: No  ? ? ? ?Allergies   ?Patient has no known allergies. ? ? ?Review of Systems ?Review of Systems   ?Constitutional:  Negative for fatigue and fever.  ?Gastrointestinal:  Positive for abdominal pain. Negative for nausea and vomiting.  ?Genitourinary:  Positive for scrotal swelling and testicular pain. Negative for dysuria, flank pain, frequency, genital sores, hematuria, penile discharge, penile pain, penile swelling and urgency.  ?Musculoskeletal:  Negative for arthralgias.  ?Skin:  Negative for rash.  ?Neurological:  Negative for weakness.  ? ? ?Physical Exam ?Triage Vital Signs ?ED Triage Vitals [01/18/22 1204]  ?Enc Vitals Group  ?   BP   ?   Pulse   ?   Resp   ?   Temp   ?   Temp src   ?   SpO2   ?   Weight   ?   Height   ?   Head Circumference   ?   Peak Flow   ?   Pain Score 7  ?   Pain Loc   ?   Pain Edu?   ?   Excl. in GC?   ? ?No data found. ? ?Updated Vital Signs ?BP 140/90 (BP Location: Left Arm)   Pulse 84   Temp 98.3 ?F (36.8 ?C) (Oral)   Resp 16   SpO2 100%  ?   ? ?Physical Exam ?Vitals and nursing note reviewed. Exam conducted with a chaperone  present.  ?Constitutional:   ?   General: He is not in acute distress. ?   Appearance: Normal appearance. He is well-developed. He is not ill-appearing.  ?HENT:  ?   Head: Normocephalic and atraumatic.  ?Eyes:  ?   General: No scleral icterus. ?   Conjunctiva/sclera: Conjunctivae normal.  ?Cardiovascular:  ?   Rate and Rhythm: Normal rate and regular rhythm.  ?Pulmonary:  ?   Effort: Pulmonary effort is normal. No respiratory distress.  ?   Breath sounds: Normal breath sounds.  ?Abdominal:  ?   Palpations: Abdomen is soft.  ?   Tenderness: There is abdominal tenderness (mild RLQ and right inguinal region). There is no right CVA tenderness or left CVA tenderness.  ?   Hernia: There is no hernia in the right inguinal area.  ?Genitourinary: ?   Penis: Normal. No tenderness, discharge or swelling.   ?   Testes:     ?   Right: Tenderness present. Swelling not present.  ?   Epididymis:  ?   Right: Tenderness present.  ?Musculoskeletal:  ?   Cervical back: Neck  supple.  ?Lymphadenopathy:  ?   Lower Body: No right inguinal adenopathy.  ?Skin: ?   General: Skin is warm and dry.  ?   Capillary Refill: Capillary refill takes less than 2 seconds.  ?Neurological:  ?   General: No focal deficit present.  ?   Mental Status: He is alert. Mental status is at baseline.  ?   Motor: No weakness.  ?   Gait: Gait normal.  ?Psychiatric:     ?   Mood and Affect: Mood normal.     ?   Behavior: Behavior normal.     ?   Thought Content: Thought content normal.  ? ? ? ?UC Treatments / Results  ?Labs ?(all labs ordered are listed, but only abnormal results are displayed) ?Labs Reviewed  ?URINALYSIS, ROUTINE W REFLEX MICROSCOPIC - Abnormal; Notable for the following components:  ?    Result Value  ? Specific Gravity, Urine >1.030 (*)   ? All other components within normal limits  ? ? ?EKG ? ? ?Radiology ?No results found. ? ?Procedures ?Procedures (including critical care time) ? ?Medications Ordered in UC ?Medications - No data to display ? ?Initial Impression / Assessment and Plan / UC Course  ?I have reviewed the triage vital signs and the nursing notes. ? ?Pertinent labs & imaging results that were available during my care of the patient were reviewed by me and considered in my medical decision making (see chart for details). ? ?36 year old male presenting for approximately 1 month history of right lower abdominal pain with radiation to the right testicle and right thigh.  Reports this testicle is a little red and warm.  It is tender to touch.  No associated fever.  Patient denies any dysuria, urethral discharge.  No urinary symptoms.  Denies any concern for STIs whatsoever. ? ?Vitals are normal stable.  Exam significant for tenderness to palpation of the right epididymis.  No obvious swelling of the scrotum.  No hernias felt. ? ?UA shows greater than 1.030 specific gravity, otherwise normal. ? ?Ultrasound of scrotum ordered today.  Advised patient we will contact him with the  results. ? ?Ultrasound results show only hydroceles.  Discussed this with patient.  However, I still do have concerns about possible epididymitis.  We will treat with Levaquin.  Also Tylenol for discomfort and scrotal elevation.  Advised patient that if symptoms  are worsening or not improving in the next few days he needs to be seen again.  Advised going to the ER as he may need CT of abdomen pelvis.  Patient agrees to plan. ? ? ?Final Clinical Impressions(s) / UC Diagnoses  ? ?Final diagnoses:  ?Pain in right testicle  ?Right groin pain  ?Epididymitis  ? ? ? ?Discharge Instructions   ? ?  ?-I have ordered an ultrasound for you.  Please proceed to [facility] medical Mall entrance and register. ?- Once I get the report I will give you a call. ?-I believe you may have epididymitis but would like to get the ultrasound to check for that and other possibilities. ? ? ? ? ?ED Prescriptions   ?None ?  ? ?PDMP not reviewed this encounter. ?  ?Shirlee Latch, PA-C ?01/18/22 1803 ? ?

## 2022-01-18 NOTE — ED Triage Notes (Signed)
Patient presents to Urgent Care with complaints of right groin pain. He states pain starts at RLQ pain that radiates to right groin and right testicle  x 1 month. He has noted some redness to testicle area. Pt states no urinary symptoms or concerns with STD. Treating pain with ibuprofen with no relief. Standing for long periods of time makes pain worse, laying or sitting makes it better.  ? ?Denies fever.  ?
